# Patient Record
Sex: Male | Born: 1980 | Hispanic: No | Marital: Married
Health system: Southern US, Community
[De-identification: ages and names within clinical notes are randomized; demographics above are authoritative.]

---

## 2008-12-25 ENCOUNTER — Encounter: Admission: RE | Admit: 2008-12-25 | Discharge: 2008-12-25 | Payer: Self-pay | Admitting: Pulmonary Disease

## 2009-11-11 IMAGING — CR DG CHEST 1V
1 series · 1 of 1 positions shown · non-contrast
Comparison: None

CLINICAL DATA: Positive PPD

CHEST - 1 VIEW

[view not recorded]
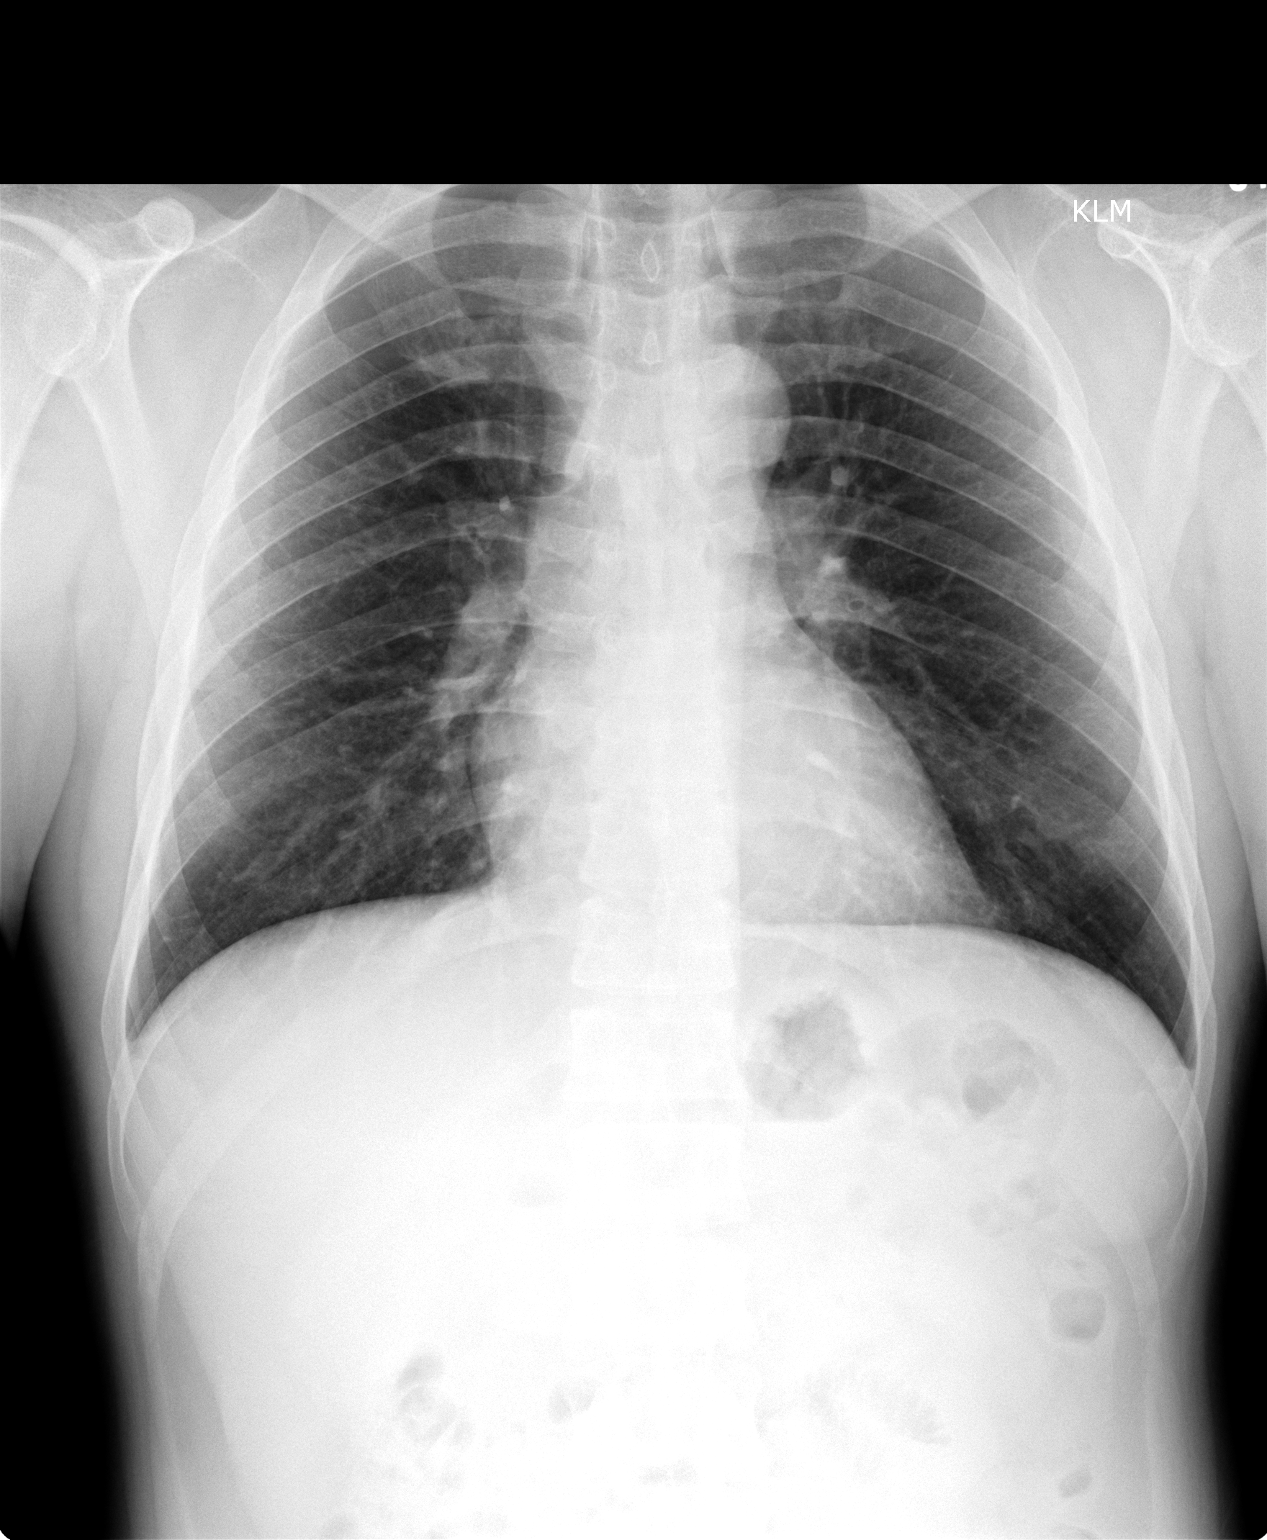

[1 of 1 positions shown; findings below may reference images not displayed]

FINDINGS: The heart size is normal.

There is no pleural effusion or pulmonary edema.

No airspace densities are identified.
IMPRESSION: 1.  No acute cardiopulmonary abnormalities.

## 2014-10-01 ENCOUNTER — Encounter: Payer: Self-pay | Admitting: Emergency Medicine

## 2014-10-02 NOTE — Telephone Encounter (Signed)
error 

## 2019-06-10 ENCOUNTER — Other Ambulatory Visit: Payer: Self-pay

## 2019-06-10 DIAGNOSIS — Z20822 Contact with and (suspected) exposure to covid-19: Secondary | ICD-10-CM

## 2019-06-13 LAB — NOVEL CORONAVIRUS, NAA: SARS-CoV-2, NAA: DETECTED — AB

## 2019-06-17 ENCOUNTER — Other Ambulatory Visit: Payer: Self-pay

## 2019-06-17 ENCOUNTER — Emergency Department (HOSPITAL_COMMUNITY)
Admission: EM | Admit: 2019-06-17 | Discharge: 2019-06-17 | Disposition: A | Discharge status: Home / Self Care | Payer: BC Managed Care – PPO | Attending: Emergency Medicine | Admitting: Emergency Medicine

## 2019-06-17 ENCOUNTER — Encounter (HOSPITAL_COMMUNITY): Payer: Self-pay | Admitting: Emergency Medicine

## 2019-06-17 DIAGNOSIS — R112 Nausea with vomiting, unspecified: Secondary | ICD-10-CM | POA: Diagnosis present

## 2019-06-17 DIAGNOSIS — R109 Unspecified abdominal pain: Secondary | ICD-10-CM | POA: Insufficient documentation

## 2019-06-17 DIAGNOSIS — Z5321 Procedure and treatment not carried out due to patient leaving prior to being seen by health care provider: Secondary | ICD-10-CM | POA: Diagnosis not present

## 2019-06-17 LAB — CBC
HCT: 44.4 % (ref 39.0–52.0)
Hemoglobin: 14.2 g/dL (ref 13.0–17.0)
MCH: 26 pg (ref 26.0–34.0)
MCHC: 32 g/dL (ref 30.0–36.0)
MCV: 81.3 fL (ref 80.0–100.0)
Platelets: 350 10*3/uL (ref 150–400)
RBC: 5.46 MIL/uL (ref 4.22–5.81)
RDW: 12.5 % (ref 11.5–15.5)
WBC: 10.2 10*3/uL (ref 4.0–10.5)
nRBC: 0 % (ref 0.0–0.2)

## 2019-06-17 LAB — COMPREHENSIVE METABOLIC PANEL
ALT: 38 U/L (ref 0–44)
AST: 34 U/L (ref 15–41)
Albumin: 4.1 g/dL (ref 3.5–5.0)
Alkaline Phosphatase: 88 U/L (ref 38–126)
Anion gap: 11 (ref 5–15)
BUN: 10 mg/dL (ref 6–20)
CO2: 23 mmol/L (ref 22–32)
Calcium: 9.4 mg/dL (ref 8.9–10.3)
Chloride: 106 mmol/L (ref 98–111)
Creatinine, Ser: 1.13 mg/dL (ref 0.61–1.24)
GFR calc Af Amer: >60 mL/min (ref >60)
GFR calc non Af Amer: >60 mL/min (ref >60)
Glucose, Bld: 162 mg/dL — ABNORMAL HIGH (ref 70–99)
Potassium: 3.7 mmol/L (ref 3.5–5.1)
Sodium: 140 mmol/L (ref 135–145)
Total Bilirubin: 0.6 mg/dL (ref 0.3–1.2)
Total Protein: 7.1 g/dL (ref 6.5–8.1)

## 2019-06-17 LAB — URINALYSIS, ROUTINE W REFLEX MICROSCOPIC
Bacteria, UA: NONE SEEN
Bilirubin Urine: NEGATIVE
Glucose, UA: NEGATIVE mg/dL
Ketones, ur: NEGATIVE mg/dL
Leukocytes,Ua: NEGATIVE
Nitrite: NEGATIVE
Protein, ur: 30 mg/dL — AB
RBC / HPF: >50 RBC/hpf — ABNORMAL HIGH (ref 0–5)
Specific Gravity, Urine: 1.025 (ref 1.005–1.030)
pH: 5 (ref 5.0–8.0)

## 2019-06-17 LAB — LIPASE, BLOOD: Lipase: 34 U/L (ref 11–51)

## 2019-06-17 MED ORDER — SODIUM CHLORIDE 0.9% FLUSH: 3.0000 mL | Freq: Once | INTRAVENOUS | Status: DC

## 2019-06-17 NOTE — ED Triage Notes (Signed)
Patient with abdominal distension, having nausea and vomiting.  No pain with palpation.  Patient has not vomited with EMS.  Patient CAOx4, coming from home, no medical history.
